# Patient Record
Sex: Male | Born: 1935 | Race: Black or African American | Hispanic: No | Marital: Married | Smoking: Never smoker
Health system: Southern US, Community
[De-identification: ages and names within clinical notes are randomized; demographics above are authoritative.]

## PROBLEM LIST (undated history)

## (undated) DIAGNOSIS — E119 Type 2 diabetes mellitus without complications: Secondary | ICD-10-CM

## (undated) DIAGNOSIS — H348122 Central retinal vein occlusion, left eye, stable: Secondary | ICD-10-CM

## (undated) DIAGNOSIS — R600 Localized edema: Secondary | ICD-10-CM

## (undated) DIAGNOSIS — L84 Corns and callosities: Secondary | ICD-10-CM

## (undated) DIAGNOSIS — M5030 Other cervical disc degeneration, unspecified cervical region: Secondary | ICD-10-CM

## (undated) DIAGNOSIS — N183 Chronic kidney disease, stage 3 unspecified: Secondary | ICD-10-CM

## (undated) DIAGNOSIS — H35039 Hypertensive retinopathy, unspecified eye: Secondary | ICD-10-CM

## (undated) DIAGNOSIS — I872 Venous insufficiency (chronic) (peripheral): Secondary | ICD-10-CM

## (undated) DIAGNOSIS — F528 Other sexual dysfunction not due to a substance or known physiological condition: Secondary | ICD-10-CM

## (undated) DIAGNOSIS — H539 Unspecified visual disturbance: Secondary | ICD-10-CM

## (undated) DIAGNOSIS — K5790 Diverticulosis of intestine, part unspecified, without perforation or abscess without bleeding: Secondary | ICD-10-CM

## (undated) DIAGNOSIS — E7800 Pure hypercholesterolemia, unspecified: Secondary | ICD-10-CM

## (undated) DIAGNOSIS — E669 Obesity, unspecified: Secondary | ICD-10-CM

## (undated) DIAGNOSIS — H524 Presbyopia: Secondary | ICD-10-CM

## (undated) DIAGNOSIS — M75120 Complete rotator cuff tear or rupture of unspecified shoulder, not specified as traumatic: Secondary | ICD-10-CM

## (undated) DIAGNOSIS — I2510 Atherosclerotic heart disease of native coronary artery without angina pectoris: Secondary | ICD-10-CM

## (undated) DIAGNOSIS — E1142 Type 2 diabetes mellitus with diabetic polyneuropathy: Secondary | ICD-10-CM

## (undated) DIAGNOSIS — G8929 Other chronic pain: Secondary | ICD-10-CM

## (undated) DIAGNOSIS — M2578 Osteophyte, vertebrae: Secondary | ICD-10-CM

## (undated) DIAGNOSIS — D126 Benign neoplasm of colon, unspecified: Secondary | ICD-10-CM

## (undated) DIAGNOSIS — Z961 Presence of intraocular lens: Secondary | ICD-10-CM

## (undated) DIAGNOSIS — H409 Unspecified glaucoma: Secondary | ICD-10-CM

## (undated) DIAGNOSIS — M25561 Pain in right knee: Secondary | ICD-10-CM

## (undated) DIAGNOSIS — K219 Gastro-esophageal reflux disease without esophagitis: Secondary | ICD-10-CM

## (undated) DIAGNOSIS — H401130 Primary open-angle glaucoma, bilateral, stage unspecified: Secondary | ICD-10-CM

## (undated) DIAGNOSIS — D239 Other benign neoplasm of skin, unspecified: Secondary | ICD-10-CM

## (undated) DIAGNOSIS — I10 Essential (primary) hypertension: Secondary | ICD-10-CM

---

## 2012-10-26 DIAGNOSIS — B353 Tinea pedis: Secondary | ICD-10-CM | POA: Insufficient documentation

## 2012-10-26 DIAGNOSIS — M75120 Complete rotator cuff tear or rupture of unspecified shoulder, not specified as traumatic: Secondary | ICD-10-CM | POA: Insufficient documentation

## 2012-10-26 DIAGNOSIS — K5730 Diverticulosis of large intestine without perforation or abscess without bleeding: Secondary | ICD-10-CM | POA: Insufficient documentation

## 2012-10-26 DIAGNOSIS — E1142 Type 2 diabetes mellitus with diabetic polyneuropathy: Secondary | ICD-10-CM | POA: Insufficient documentation

## 2012-12-11 DIAGNOSIS — H524 Presbyopia: Secondary | ICD-10-CM | POA: Insufficient documentation

## 2012-12-11 DIAGNOSIS — Z961 Presence of intraocular lens: Secondary | ICD-10-CM | POA: Insufficient documentation

## 2013-04-11 DIAGNOSIS — K5900 Constipation, unspecified: Secondary | ICD-10-CM | POA: Insufficient documentation

## 2013-04-16 DIAGNOSIS — K219 Gastro-esophageal reflux disease without esophagitis: Secondary | ICD-10-CM | POA: Insufficient documentation

## 2013-07-16 DIAGNOSIS — I2510 Atherosclerotic heart disease of native coronary artery without angina pectoris: Secondary | ICD-10-CM | POA: Insufficient documentation

## 2013-07-29 DIAGNOSIS — N183 Chronic kidney disease, stage 3 unspecified: Secondary | ICD-10-CM | POA: Insufficient documentation

## 2013-07-29 DIAGNOSIS — E7800 Pure hypercholesterolemia, unspecified: Secondary | ICD-10-CM | POA: Insufficient documentation

## 2013-07-29 DIAGNOSIS — H269 Unspecified cataract: Secondary | ICD-10-CM | POA: Insufficient documentation

## 2013-07-29 DIAGNOSIS — I10 Essential (primary) hypertension: Secondary | ICD-10-CM | POA: Insufficient documentation

## 2013-07-29 DIAGNOSIS — H35039 Hypertensive retinopathy, unspecified eye: Secondary | ICD-10-CM | POA: Insufficient documentation

## 2013-08-11 DIAGNOSIS — H401130 Primary open-angle glaucoma, bilateral, stage unspecified: Secondary | ICD-10-CM | POA: Insufficient documentation

## 2013-09-19 DIAGNOSIS — Z9849 Cataract extraction status, unspecified eye: Secondary | ICD-10-CM | POA: Insufficient documentation

## 2013-09-29 DIAGNOSIS — H348192 Central retinal vein occlusion, unspecified eye, stable: Secondary | ICD-10-CM | POA: Insufficient documentation

## 2013-11-11 DIAGNOSIS — E669 Obesity, unspecified: Secondary | ICD-10-CM | POA: Insufficient documentation

## 2014-02-17 DIAGNOSIS — L853 Xerosis cutis: Secondary | ICD-10-CM | POA: Insufficient documentation

## 2014-03-06 DIAGNOSIS — I872 Venous insufficiency (chronic) (peripheral): Secondary | ICD-10-CM | POA: Insufficient documentation

## 2014-04-13 DIAGNOSIS — Z961 Presence of intraocular lens: Secondary | ICD-10-CM | POA: Insufficient documentation

## 2014-04-13 DIAGNOSIS — E119 Type 2 diabetes mellitus without complications: Secondary | ICD-10-CM | POA: Insufficient documentation

## 2015-10-26 DIAGNOSIS — M2578 Osteophyte, vertebrae: Secondary | ICD-10-CM | POA: Insufficient documentation

## 2015-10-26 DIAGNOSIS — M5030 Other cervical disc degeneration, unspecified cervical region: Secondary | ICD-10-CM | POA: Insufficient documentation

## 2017-02-14 DIAGNOSIS — M25561 Pain in right knee: Secondary | ICD-10-CM

## 2017-02-14 DIAGNOSIS — R600 Localized edema: Secondary | ICD-10-CM | POA: Insufficient documentation

## 2017-02-14 DIAGNOSIS — G8929 Other chronic pain: Secondary | ICD-10-CM | POA: Insufficient documentation

## 2017-07-05 ENCOUNTER — Emergency Department (HOSPITAL_BASED_OUTPATIENT_CLINIC_OR_DEPARTMENT_OTHER)
Admission: EM | Admit: 2017-07-05 | Discharge: 2017-07-05 | Disposition: A | Discharge status: Home / Self Care | Payer: Medicare Other | Attending: Physician Assistant | Admitting: Physician Assistant

## 2017-07-05 ENCOUNTER — Other Ambulatory Visit: Payer: Self-pay

## 2017-07-05 ENCOUNTER — Encounter (HOSPITAL_BASED_OUTPATIENT_CLINIC_OR_DEPARTMENT_OTHER): Payer: Self-pay

## 2017-07-05 ENCOUNTER — Emergency Department (HOSPITAL_BASED_OUTPATIENT_CLINIC_OR_DEPARTMENT_OTHER): Payer: Medicare Other

## 2017-07-05 DIAGNOSIS — I10 Essential (primary) hypertension: Secondary | ICD-10-CM | POA: Insufficient documentation

## 2017-07-05 DIAGNOSIS — E119 Type 2 diabetes mellitus without complications: Secondary | ICD-10-CM | POA: Diagnosis not present

## 2017-07-05 DIAGNOSIS — L03116 Cellulitis of left lower limb: Secondary | ICD-10-CM | POA: Insufficient documentation

## 2017-07-05 DIAGNOSIS — Z23 Encounter for immunization: Secondary | ICD-10-CM | POA: Insufficient documentation

## 2017-07-05 DIAGNOSIS — M25512 Pain in left shoulder: Secondary | ICD-10-CM | POA: Diagnosis not present

## 2017-07-05 DIAGNOSIS — M79662 Pain in left lower leg: Secondary | ICD-10-CM | POA: Diagnosis present

## 2017-07-05 HISTORY — DX: Diverticulosis of intestine, part unspecified, without perforation or abscess without bleeding: K57.90

## 2017-07-05 HISTORY — DX: Unspecified glaucoma: H40.9

## 2017-07-05 HISTORY — DX: Gastro-esophageal reflux disease without esophagitis: K21.9

## 2017-07-05 HISTORY — DX: Pure hypercholesterolemia, unspecified: E78.00

## 2017-07-05 HISTORY — DX: Chronic kidney disease, stage 3 unspecified: N18.30

## 2017-07-05 HISTORY — DX: Central retinal vein occlusion, left eye, stable: H34.8122

## 2017-07-05 HISTORY — DX: Other benign neoplasm of skin, unspecified: D23.9

## 2017-07-05 HISTORY — DX: Pain in right knee: M25.561

## 2017-07-05 HISTORY — DX: Other sexual dysfunction not due to a substance or known physiological condition: F52.8

## 2017-07-05 HISTORY — DX: Presbyopia: H52.4

## 2017-07-05 HISTORY — DX: Hypertensive retinopathy, unspecified eye: H35.039

## 2017-07-05 HISTORY — DX: Essential (primary) hypertension: I10

## 2017-07-05 HISTORY — DX: Venous insufficiency (chronic) (peripheral): I87.2

## 2017-07-05 HISTORY — DX: Other chronic pain: G89.29

## 2017-07-05 HISTORY — DX: Unspecified visual disturbance: H53.9

## 2017-07-05 HISTORY — DX: Benign neoplasm of colon, unspecified: D12.6

## 2017-07-05 HISTORY — DX: Primary open-angle glaucoma, bilateral, stage unspecified: H40.1130

## 2017-07-05 HISTORY — DX: Corns and callosities: L84

## 2017-07-05 HISTORY — DX: Osteophyte, vertebrae: M25.78

## 2017-07-05 HISTORY — DX: Type 2 diabetes mellitus with diabetic polyneuropathy: E11.42

## 2017-07-05 HISTORY — DX: Localized edema: R60.0

## 2017-07-05 HISTORY — DX: Presence of intraocular lens: Z96.1

## 2017-07-05 HISTORY — DX: Chronic kidney disease, stage 3 (moderate): N18.3

## 2017-07-05 HISTORY — DX: Type 2 diabetes mellitus without complications: E11.9

## 2017-07-05 HISTORY — DX: Atherosclerotic heart disease of native coronary artery without angina pectoris: I25.10

## 2017-07-05 HISTORY — DX: Obesity, unspecified: E66.9

## 2017-07-05 HISTORY — DX: Complete rotator cuff tear or rupture of unspecified shoulder, not specified as traumatic: M75.120

## 2017-07-05 HISTORY — DX: Other cervical disc degeneration, unspecified cervical region: M50.30

## 2017-07-05 MED ORDER — ACETAMINOPHEN 325 MG PO TABS
650.0000 mg | ORAL_TABLET | Freq: Once | ORAL | Status: AC
Start: 2017-07-05 — End: 2017-07-05
  Administered 2017-07-05: 650 mg via ORAL
  Filled 2017-07-05: qty 2

## 2017-07-05 MED ORDER — TETANUS-DIPHTH-ACELL PERTUSSIS 5-2.5-18.5 LF-MCG/0.5 IM SUSP
0.5000 mL | Freq: Once | INTRAMUSCULAR | Status: AC
  Administered 2017-07-05: 0.5 mL via INTRAMUSCULAR
  Filled 2017-07-05: qty 0.5

## 2017-07-05 MED ORDER — CEPHALEXIN 500 MG PO CAPS: 500.0000 mg | ORAL_CAPSULE | Freq: Four times a day (QID) | ORAL | 0 refills | Status: DC

## 2017-07-05 MED ORDER — CEPHALEXIN 250 MG PO CAPS
500.0000 mg | ORAL_CAPSULE | Freq: Once | ORAL | Status: AC
  Administered 2017-07-05: 500 mg via ORAL
  Filled 2017-07-05: qty 2

## 2017-07-05 NOTE — ED Provider Notes (Signed)
[facility] Provider Note   CSN: [id number] Arrival date & time: 07/05/17  1157     History   Chief Complaint Chief Complaint  Patient presents with  . Trauma    HPI Jesus Atkins is a 84 y.o. male.  HPI  Patient is an 84 year old male presenting after a fall 6 days ago.  Patient reports that he was going up an incline in his tractor and he and the tractor fell over.  He reports injury to his left lower leg and his left shoulder.  Patient reports that his been about a week but he still has pain with full range of motion of his shoulder.  And a little bit of tenderness in his left lower leg.  Patient reports he still able to move everything so he is sure that nothing is broken want to come here to get evaluated.  He just noted today that he had some redness in that left lower extremity.  Past Medical History:  Diagnosis Date  . Diabetes mellitus without complication ([place])   . Glaucoma   . High cholesterol   . Hypertension     There are no active problems to display for this patient.       Home Medications    Prior to Admission medications   Not on File    Family History No family history on file.  Social History Social History   Tobacco Use  . Smoking status: Never Smoker  . Smokeless tobacco: Never Used  Substance Use Topics  . Alcohol use: No    Frequency: Never  . Drug use: No     Allergies   Patient has no known allergies.   Review of Systems Review of Systems  Constitutional: Negative for activity change.  Respiratory: Negative for shortness of breath.   Cardiovascular: Negative for chest pain.  Gastrointestinal: Negative for abdominal pain.     Physical Exam Updated Vital Signs BP (!) 146/81 (BP Location: Right Arm)   Pulse 72   Temp 98.4 F (36.9 C) (Oral)   Resp 18   SpO2 97%   Physical Exam  Constitutional: He is oriented to person, place, and time. He appears well-nourished.  HENT:  Head:  Normocephalic.  Eyes: Conjunctivae are normal.  Cardiovascular: Normal rate and regular rhythm.  No murmur heard. Pulmonary/Chest: Effort normal and breath sounds normal. No respiratory distress.  Musculoskeletal:  Good range of motion of knee and ankle.  Mild warmth and erythema to the tib-fib area.  Left shoulder with good range of motion.  Patient reports a little bit of pain at complete extension however patient is able to do it easily without numbness.  Neurological: He is oriented to person, place, and time.  Skin: Skin is warm and dry. He is not diaphoretic.  Left lower leg with erythema and warmth.  Psychiatric: He has a normal mood and affect. His behavior is normal.     ED Treatments / Results  Labs (all labs ordered are listed, but only abnormal results are displayed) Labs Reviewed - No data to display  EKG  EKG Interpretation None       Radiology Dg Tibia/fibula Left  Result Date: 07/05/2017 CLINICAL DATA:  Left lower leg pain, redness and swelling since a yard tractor fell on the patient 06/29/2017. Initial encounter. EXAM: LEFT TIBIA AND FIBULA - 2 VIEW COMPARISON:  None. FINDINGS: No acute bony or joint abnormality is identified. There is infiltration of subcutaneous fat which could be due  to edema, cellulitis or contusion. Mild degenerative disease about the knee is noted. IMPRESSION: No acute bony abnormality. Infiltration of subcutaneous fat could be due to edema, cellulitis or contusion. Electronically Signed   By: Inge Rise M.D.   On: 07/05/2017 13:35   Dg Shoulder Left  Result Date: 07/05/2017 CLINICAL DATA:  Left shoulder pain after tractor injury. EXAM: LEFT SHOULDER - 2+ VIEW COMPARISON:  No recent. FINDINGS: Prominent acromioclavicular and glenohumeral degenerative change. Small bony density noted adjacent to the superior glenoid labrum. A fracture of the superior glenoid labrum, age undetermined could present in this fashion. A loose body could also  present this fashion. IMPRESSION: Prominent acromioclavicular glenohumeral degenerative change. Small bony density noted along the superior glenoid labrum. This may represent a fracture, age undetermined. This could also represent a loose body. Electronically Signed   By: Marcello Moores  Register   On: 07/05/2017 13:33    Procedures Procedures (including critical care time)  Medications Ordered in ED Medications  Tdap (BOOSTRIX) injection 0.5 mL (0.5 mLs Intramuscular Given 07/05/17 1524)  acetaminophen (TYLENOL) tablet 650 mg (650 mg Oral Given 07/05/17 1524)     Initial Impression / Assessment and Plan / ED Course  I have reviewed the triage vital signs and the nursing notes.  Pertinent labs & imaging results that were available during my care of the patient were reviewed by me and considered in my medical decision making (see chart for details).     Patient is an 84 year old male presenting after a fall 6 days ago.  Patient reports that he was going up an incline in his tractor and he and the tractor fell over.  He reports injury to his left lower leg and his left shoulder.  Patient reports that his been about a week but he still has pain with full range of motion of his shoulder.  And a little bit of tenderness in his left lower leg.  Patient reports he still able to move everything so he is sure that nothing is broken want to come here to get evaluated.  He just noted today that he had some redness in that left lower extremity.  3:50 PM X-ray [place] about whether there is a fracture or not.  Given that there is no erythema or or signs of trauma, I do not think is likely to have a fracture.  Will have him follow-up with orthopedics.  And his primary care.  As for his left lower leg.  I think he likely sustained an small abrasion during the fall which is now turned into cellulitis.  However will do ultrasound today to rule out DVT.  [place] negative., Will treat for cellulitis, have him followiwth  PCP this week and ortho for his shoudler.   Final Clinical Impressions(s) / ED Diagnoses   Final diagnoses:  None    ED Discharge Orders    None       Macarthur Critchley, MD 07/05/17 2305

## 2017-07-05 NOTE — Discharge Instructions (Signed)
Please follow-up with your primary care provider in 3 days to recheck your cellulitis.  You can also follow-up with the orthopedist about your left shoulder pain.

## 2017-07-05 NOTE — ED Triage Notes (Signed)
Pt states a tractor turned over on him 6 days ago-pain to left shoulder and left tib/fib-pt NAD-walking with limp and a cane

## 2017-07-05 NOTE — ED Notes (Signed)
Patient transported to Ultrasound 

## 2017-07-10 ENCOUNTER — Ambulatory Visit: Payer: Medicare Other | Admitting: Family Medicine

## 2017-07-10 ENCOUNTER — Ambulatory Visit: Payer: Self-pay

## 2017-07-10 ENCOUNTER — Encounter: Payer: Self-pay | Admitting: Family Medicine

## 2017-07-10 VITALS — BP 148/75 | HR 82 | Ht 72.0 in | Wt 246.0 lb

## 2017-07-10 DIAGNOSIS — M25512 Pain in left shoulder: Secondary | ICD-10-CM | POA: Diagnosis not present

## 2017-07-10 DIAGNOSIS — M79605 Pain in left leg: Secondary | ICD-10-CM

## 2017-07-10 NOTE — Patient Instructions (Signed)
Start using your compression stockings for your legs. Finish the keflex as directed. Elevate above your heart when possible to help with the swelling.  You have strained your rotator cuff with a small tear in your supraspinatus. Try to avoid painful activities (overhead activities, lifting with extended arm) as much as possible for the next 5 weeks. Topical capsaicin, biofreeze, salon pas may be helpful if needed for pain. Can take tylenol in addition to this. Consider physical therapy with transition to home exercise program. Do home exercise program with theraband and scapular stabilization exercises daily 3 sets of 10 once a day - wait about a week before starting this. If not improving at follow-up we will consider further imaging, physical therapy, and/or nitro patches. Follow up with me in 1 month for reevaluation.

## 2017-07-11 ENCOUNTER — Encounter: Payer: Self-pay | Admitting: Family Medicine

## 2017-07-11 DIAGNOSIS — M25512 Pain in left shoulder: Secondary | ICD-10-CM | POA: Insufficient documentation

## 2017-07-11 DIAGNOSIS — M79605 Pain in left leg: Secondary | ICD-10-CM | POA: Insufficient documentation

## 2017-07-11 NOTE — Assessment & Plan Note (Signed)
advised to complete keflex.  Start using his compression stockings for venous insufficiency.  Elevate above his heart.

## 2017-07-11 NOTE — Progress Notes (Signed)
PCP: Beckie Salts, MD  Subjective:   HPI: Patient is a 84 y.o. male here for left shoulder, leg injuries.  Patient reports 1 week ago he was on a riding lawnmower collective leaves and he turned, the lawnmower fell over. This did not land on him but threw him clear - thinks left leg struck steering wheel as he went down though. Landed directly onto left shoulder. Pain primarily in his shoulder now at 8/10 level, sharp, worse with overhead motions and trying to lift items. He also has some left leg pain that was up to 8/10. He is taking keflex for possible cellulitis. No other skin changes. No fevers, numbness.  Past Medical History:  Diagnosis Date  . Benign neoplasm of colon   . Benign neoplasm of skin   . Bilateral lower extremity edema   . Central retinal vein occlusion of left eye   . Chronic pain of right knee   . Complete rupture of rotator cuff   . Corns and callosity   . Coronary atherosclerosis   . Degenerative disc disease, cervical   . Diabetes mellitus without complication ([place])   . Diabetic polyneuropathy associated with type 2 diabetes mellitus ([place])   . Diverticulosis   . GERD (gastroesophageal reflux disease)   . Glaucoma   . High cholesterol   . Hypertension   . Hypertensive retinopathy   . Obesity   . Osteophyte of cervical spine   . Presbyopia   . Primary open angle glaucoma (POAG) of both eyes   . Pseudophakia of both eyes   . Psychosexual dysfunction with inhibited sexual excitement   . Stage 3 chronic kidney disease ([place])   . Stasis dermatitis of both legs   . Visual disturbances   . Visual disturbances     Current Outpatient Medications on File Prior to Visit  Medication Sig Dispense Refill  . acetaminophen (TYLENOL) 500 MG tablet Take 500 mg by mouth every 6 (six) hours as needed.    . brimonidine (ALPHAGAN P) 0.1 % SOLN     . cephALEXin (KEFLEX) 500 MG capsule Take 1 capsule (500 mg total) by mouth 4 (four) times daily. 40 capsule 0  .  diclofenac sodium (VOLTAREN) 1 % GEL Apply topically 4 (four) times daily.    . furosemide (LASIX) 20 MG tablet Take 20 mg by mouth.    Marland Kitchen glimepiride (AMARYL) 2 MG tablet Take 2 mg by mouth daily with breakfast.    . latanoprost (XALATAN) 0.005 % ophthalmic solution 1 drop at bedtime.    Marland Kitchen losartan (COZAAR) 50 MG tablet Take 50 mg by mouth daily.    . metoprolol succinate (TOPROL-XL) 25 MG 24 hr tablet Take 25 mg by mouth daily.    . simvastatin (ZOCOR) 10 MG tablet Take 10 mg by mouth daily.    . timolol (BETIMOL) 0.25 % ophthalmic solution 1-2 drops 2 (two) times daily.    . timolol (TIMOPTIC) 0.5 % ophthalmic solution 1 drop 2 (two) times daily.     No current facility-administered medications on file prior to visit.     History reviewed. No pertinent surgical history.  Allergies  Allergen Reactions  . Ace Inhibitors   . Dorzolamide   . Lovastatin     Social History   Socioeconomic History  . Marital status: Married    Spouse name: Not on file  . Number of children: Not on file  . Years of education: Not on file  . Highest education level: Not on file  Social Needs  . Financial resource strain: Not on file  . Food insecurity - worry: Not on file  . Food insecurity - inability: Not on file  . Transportation needs - medical: Not on file  . Transportation needs - non-medical: Not on file  Occupational History  . Not on file  Tobacco Use  . Smoking status: Never Smoker  . Smokeless tobacco: Never Used  Substance and Sexual Activity  . Alcohol use: No    Frequency: Never  . Drug use: No  . Sexual activity: Not on file  Other Topics Concern  . Not on file  Social History Narrative  . Not on file    History reviewed. No pertinent family history.  BP (!) 148/75   Pulse 82   Ht 6' (1.829 m)   Wt 246 lb (111.6 kg)   BMI 33.36 kg/m   Review of Systems: See HPI above.     Objective:  Physical Exam:  Gen: NAD, comfortable in exam room  Left shoulder: No  swelling, ecchymoses.  No gross deformity. No TTP of AC joint, biceps tendon. Full ER, IR.  Abduction and flexion limited to about 120 degrees, painful. Positive Hawkins, Neers. Negative Yergasons. Strength 4/5 with empty can and 5/5 resisted internal/external rotation.  Pain with empty can. Negative apprehension. NV intact distally.  Right shoulder: No swelling, ecchymoses.  No gross deformity. No TTP. FROM. Strength 5/5 with empty can and resisted internal/external rotation. NV intact distally.  Left lower leg: Chronic skin changes with mild thickening, hyperpigmentation and edema.  Minimal redness and warmth but similar to right lower leg. TTP over anterior tibia similar to right leg.  No calf tenderness. FROM ankle and knee with 5/5 strength, no pain. NVI distally.  MSK u/s left shoulder:  Subscapularis and infraspinatus without apparent tears.  Supraspinatus appears thinner than expected with a partial thickness tear evident on insertional side.   Assessment & Plan:  1. Left shoulder injury - 2/2 rotator cuff strain with small supraspinatus tear.  He's had problems with shoulder in past and noted rotator cuff tear on his problem list though other records not available - discussed possible this is an old tear.  Start with relative rest, home exercise program.  Tylenol with topical medications.  Consider physical therapy, nitro patches.  F/u in 1 month.  2. Left leg pain - advised to complete keflex.  Start using his compression stockings for venous insufficiency.  Elevate above his heart.

## 2017-07-11 NOTE — Assessment & Plan Note (Signed)
2/2 rotator cuff strain with small supraspinatus tear.  He's had problems with shoulder in past and noted rotator cuff tear on his problem list though other records not available - discussed possible this is an old tear.  Start with relative rest, home exercise program.  Tylenol with topical medications.  Consider physical therapy, nitro patches.  F/u in 1 month.

## 2017-08-07 ENCOUNTER — Encounter: Payer: Self-pay | Admitting: Family Medicine

## 2017-08-07 ENCOUNTER — Ambulatory Visit: Payer: Medicare Other | Admitting: Family Medicine

## 2017-08-07 DIAGNOSIS — M25512 Pain in left shoulder: Secondary | ICD-10-CM | POA: Diagnosis not present

## 2017-08-07 DIAGNOSIS — M79605 Pain in left leg: Secondary | ICD-10-CM | POA: Diagnosis not present

## 2017-08-07 NOTE — Patient Instructions (Signed)
You're doing great! Use compression stocking on your legs to help with the swelling. This small hematoma below your knee should improve with time and go away. Icing only if needed, tylenol only if needed at this point. Elevate above your heart when possible for swelling if needed.

## 2017-08-08 ENCOUNTER — Encounter: Payer: Self-pay | Admitting: Family Medicine

## 2017-08-08 NOTE — Assessment & Plan Note (Signed)
2/2 rotator cuff strain with small supraspinatus tear which is possibly old.  Much improved clinically.  Icing, tylenol only if needed.  No restrictions.  F/u prn.

## 2017-08-08 NOTE — Progress Notes (Signed)
PCP: Beckie Salts, MD  Subjective:   HPI: Patient is a 84 y.o. male here for left shoulder, leg injuries.  2/19: Patient reports 1 week ago he was on a riding lawnmower collective leaves and he turned, the lawnmower fell over. This did not land on him but threw him clear - thinks left leg struck steering wheel as he went down though. Landed directly onto left shoulder. Pain primarily in his shoulder now at 8/10 level, sharp, worse with overhead motions and trying to lift items. He also has some left leg pain that was up to 8/10. He is taking keflex for possible cellulitis. No other skin changes. No fevers, numbness.  3/19: Patient reports he's doing well. He has some mild nagging pain lateral left shoulder only at nighttime and not every night. His left leg is better - still gets swelling but the warmth, redness has resolved. He is done with antibiotics for this. No skin changes, numbness.  Past Medical History:  Diagnosis Date  . Benign neoplasm of colon   . Benign neoplasm of skin   . Bilateral lower extremity edema   . Central retinal vein occlusion of left eye   . Chronic pain of right knee   . Complete rupture of rotator cuff   . Corns and callosity   . Coronary atherosclerosis   . Degenerative disc disease, cervical   . Diabetes mellitus without complication ([place])   . Diabetic polyneuropathy associated with type 2 diabetes mellitus ([place])   . Diverticulosis   . GERD (gastroesophageal reflux disease)   . Glaucoma   . High cholesterol   . Hypertension   . Hypertensive retinopathy   . Obesity   . Osteophyte of cervical spine   . Presbyopia   . Primary open angle glaucoma (POAG) of both eyes   . Pseudophakia of both eyes   . Psychosexual dysfunction with inhibited sexual excitement   . Stage 3 chronic kidney disease ([place])   . Stasis dermatitis of both legs   . Visual disturbances   . Visual disturbances     Current Outpatient Medications on File Prior to Visit   Medication Sig Dispense Refill  . acetaminophen (TYLENOL) 500 MG tablet Take 500 mg by mouth every 6 (six) hours as needed.    . brimonidine (ALPHAGAN P) 0.1 % SOLN     . cephALEXin (KEFLEX) 500 MG capsule Take 1 capsule (500 mg total) by mouth 4 (four) times daily. 40 capsule 0  . diclofenac sodium (VOLTAREN) 1 % GEL Apply topically 4 (four) times daily.    . furosemide (LASIX) 20 MG tablet Take 20 mg by mouth.    Marland Kitchen glimepiride (AMARYL) 2 MG tablet Take 2 mg by mouth daily with breakfast.    . latanoprost (XALATAN) 0.005 % ophthalmic solution 1 drop at bedtime.    Marland Kitchen losartan (COZAAR) 50 MG tablet Take 50 mg by mouth daily.    . metoprolol succinate (TOPROL-XL) 25 MG 24 hr tablet Take 25 mg by mouth daily.    . simvastatin (ZOCOR) 10 MG tablet Take 10 mg by mouth daily.    . timolol (BETIMOL) 0.25 % ophthalmic solution 1-2 drops 2 (two) times daily.    . timolol (TIMOPTIC) 0.5 % ophthalmic solution 1 drop 2 (two) times daily.     No current facility-administered medications on file prior to visit.     History reviewed. No pertinent surgical history.  Allergies  Allergen Reactions  . Ace Inhibitors   . Dorzolamide   .  Lovastatin     Social History   Socioeconomic History  . Marital status: Married    Spouse name: Not on file  . Number of children: Not on file  . Years of education: Not on file  . Highest education level: Not on file  Social Needs  . Financial resource strain: Not on file  . Food insecurity - worry: Not on file  . Food insecurity - inability: Not on file  . Transportation needs - medical: Not on file  . Transportation needs - non-medical: Not on file  Occupational History  . Not on file  Tobacco Use  . Smoking status: Never Smoker  . Smokeless tobacco: Never Used  Substance and Sexual Activity  . Alcohol use: No    Frequency: Never  . Drug use: No  . Sexual activity: Not on file  Other Topics Concern  . Not on file  Social History Narrative  . Not  on file    History reviewed. No pertinent family history.  BP 113/74   Pulse 90   Ht 6' (1.829 m)   Wt 246 lb (111.6 kg)   BMI 33.36 kg/m   Review of Systems: See HPI above.     Objective:  Physical Exam:  Gen: NAD, comfortable in exam room.  Left shoulder: No swelling, ecchymoses.  No gross deformity. No TTP. Full ER, IR, abduction and flexion to 150 degrees without painful arc. Negative Hawkins, Neers. Negative Yergasons. Strength 5/5 with empty can and resisted internal/external rotation. Negative apprehension. NV intact distally.  Left lower leg: Chronic skin changes with mild thickening, hyperpigmentation, and edema.  Small focal area of tenderness and swelling proximal anterior lower leg.  No redness or warmth now. No other tenderness to palpation. FROM knee without pain.  5/5 strength.   Assessment & Plan:  1. Left shoulder injury - 2/2 rotator cuff strain with small supraspinatus tear which is possibly old.  Much improved clinically.  Icing, tylenol only if needed.  No restrictions.  F/u prn.  2. Left leg pain - encouraged compression stockings.  Elevation, icing, tylenol if needed.

## 2017-08-08 NOTE — Assessment & Plan Note (Signed)
encouraged compression stockings.  Elevation, icing, tylenol if needed.

## 2019-05-09 IMAGING — DX DG TIBIA/FIBULA 2V*L*
4 series · 4 of 4 positions shown · non-contrast
Comparison: None.

CLINICAL DATA: Left lower leg pain, redness and swelling since a
yard tractor fell on the patient 06/29/2017. Initial encounter.

EXAM:
LEFT TIBIA AND FIBULA - 2 VIEW

[tibia ap (1 of 2)]
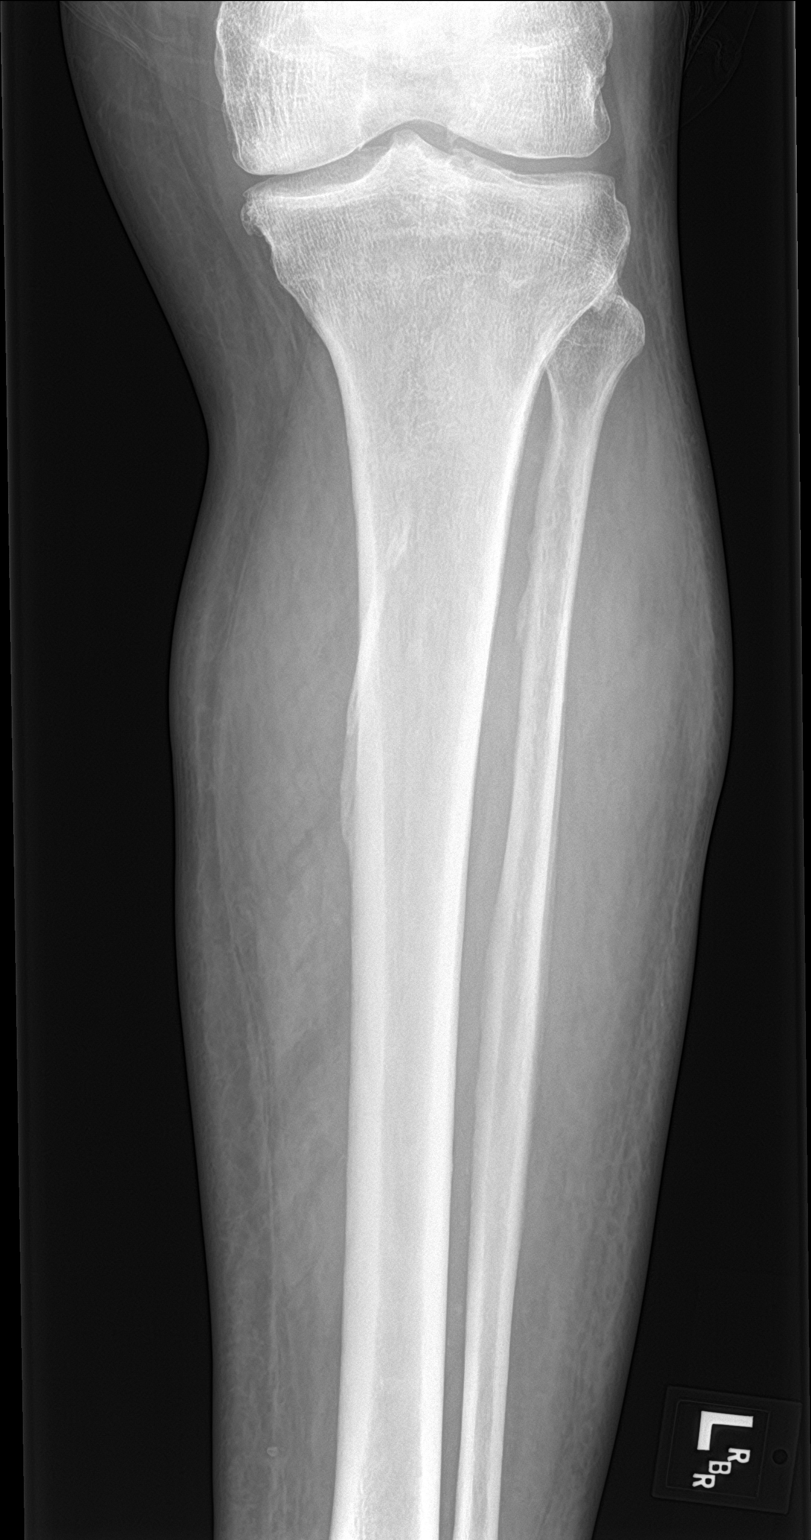

[tibia ap (2 of 2)]
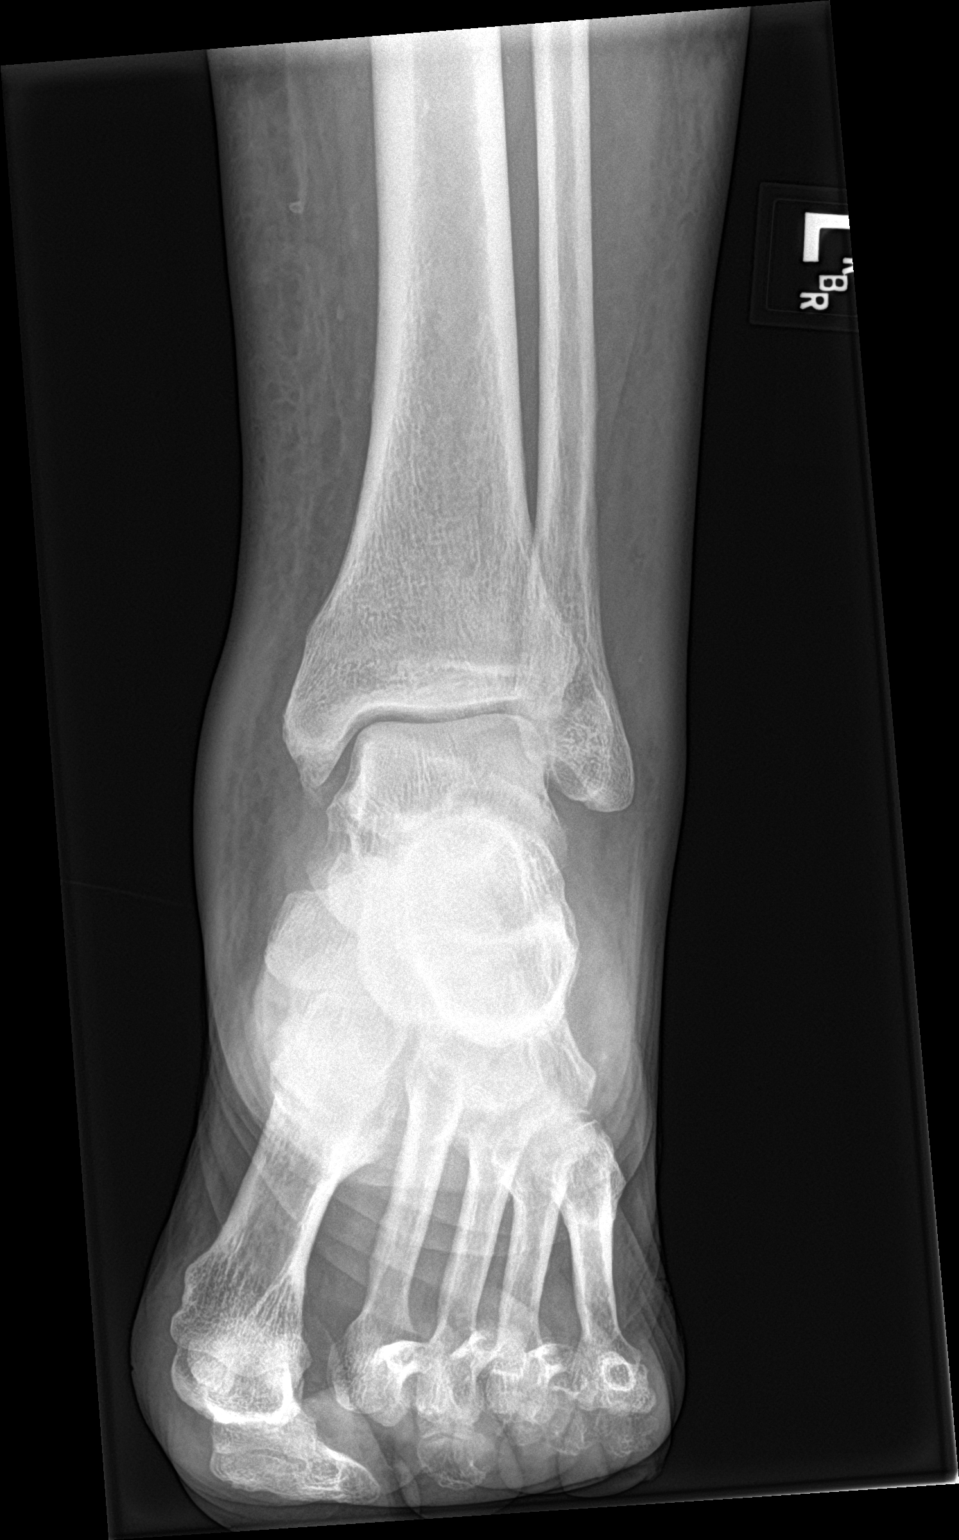

[tibia lat (1 of 2)]
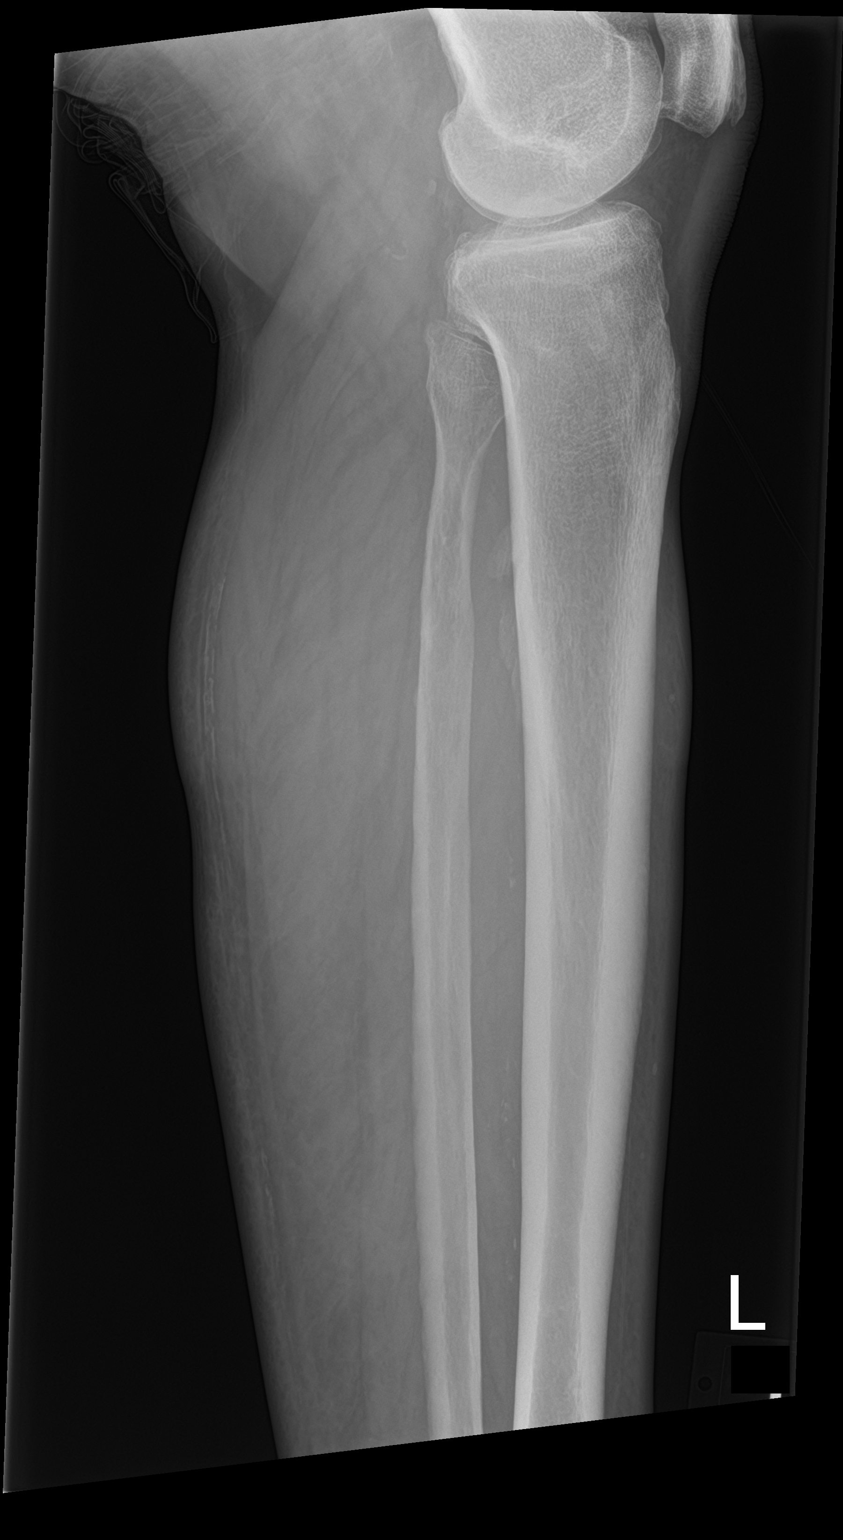

[tibia lat (2 of 2)]
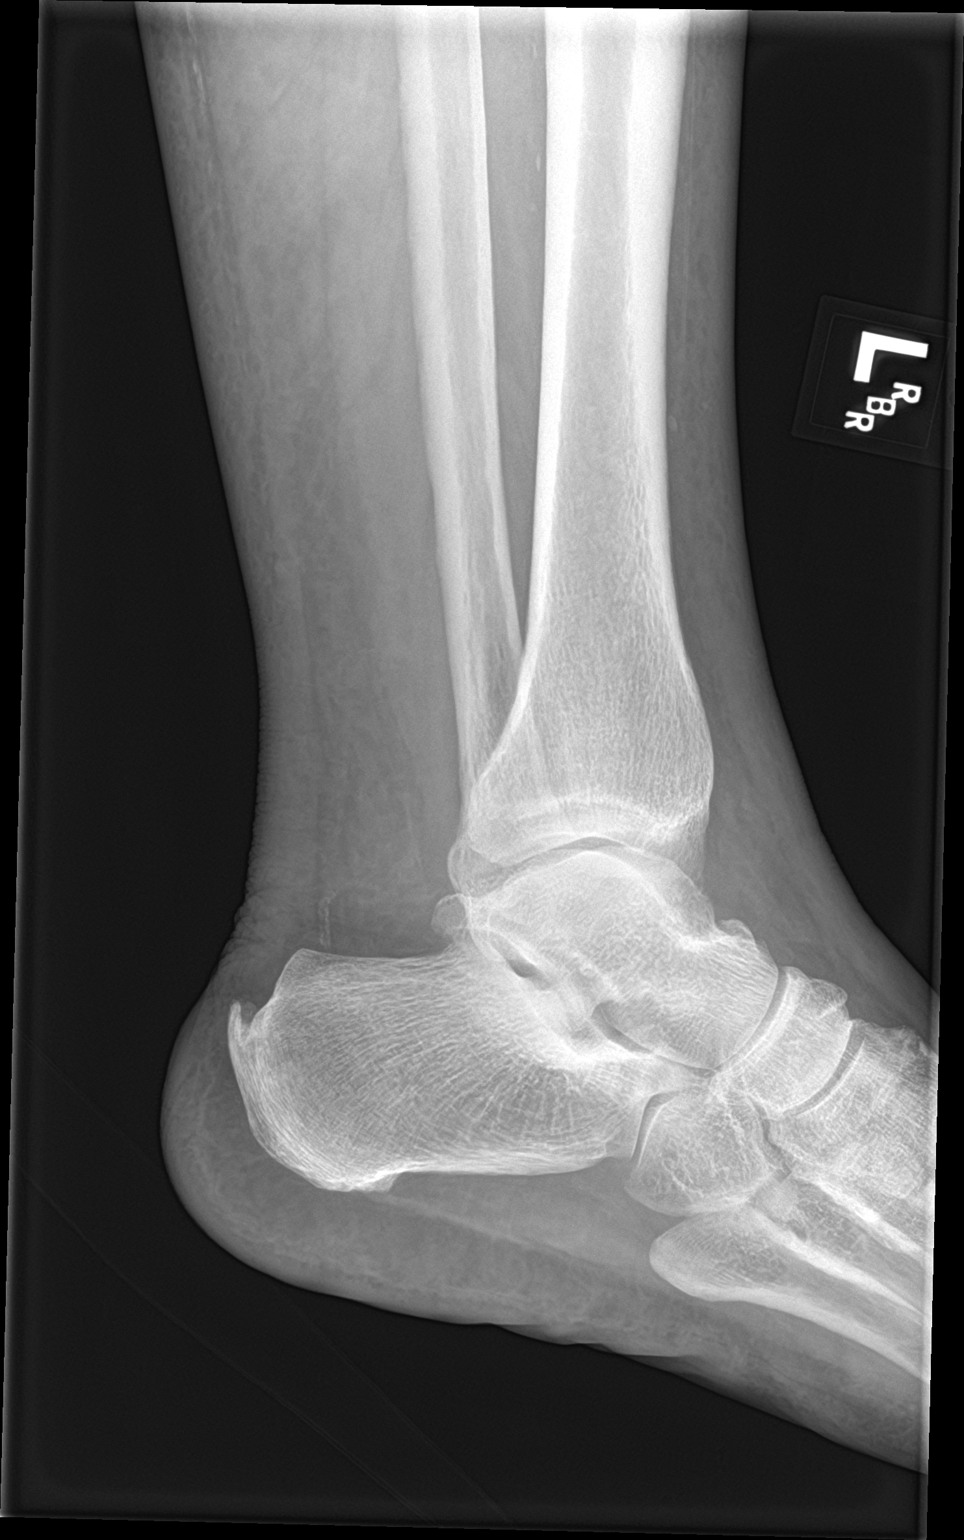

[4 of 4 positions shown; findings below may reference images not displayed]

FINDINGS: No acute bony or joint abnormality is identified. There is
infiltration of subcutaneous fat which could be due to edema,
cellulitis or contusion. Mild degenerative disease about the knee is
noted.
IMPRESSION: No acute bony abnormality.

Infiltration of subcutaneous fat could be due to edema, cellulitis
or contusion.

## 2019-12-08 ENCOUNTER — Ambulatory Visit: Payer: Self-pay

## 2019-12-08 ENCOUNTER — Ambulatory Visit (INDEPENDENT_AMBULATORY_CARE_PROVIDER_SITE_OTHER): Payer: Medicare Other | Admitting: Family Medicine

## 2019-12-08 VITALS — BP 138/62 | Ht 71.5 in | Wt 242.0 lb

## 2019-12-08 DIAGNOSIS — M25512 Pain in left shoulder: Secondary | ICD-10-CM

## 2019-12-08 NOTE — Patient Instructions (Addendum)
You have torn your rotator cuff in your left shoulder. Consider an MRI only if you'd consider having surgery to fix this - MRI would tell us to what extent this could be fixed. Sling if needed to help with pain. Icing 15 minutes at a time 3-4 times a day. Tylenol 500mg  1-2 tabs three times a day as needed for pain. Topical voltaren gel up to 4 times a day as needed for pain. Do motion exercises as directed at least twice a day. Follow up with me in 1 month.

## 2019-12-09 ENCOUNTER — Encounter: Payer: Self-pay | Admitting: Family Medicine

## 2019-12-09 NOTE — Progress Notes (Signed)
PCP: Beckie Salts, MD  Subjective:   HPI: Patient is a 86 y.o. male here for left shoulder injury.  Patient reports on 7/13 he stepped off a curb and fell landing directly onto his left shoulder with his hand behind him. Immediate pain, difficulty moving left arm since that time - reports having some difficulties prior to this. Swelling and bruising into anterior arm and elbow. Radiographs of left shoulder were negative for acute fracture, separation, dislocation.  Past Medical History:  Diagnosis Date  . Benign neoplasm of colon   . Benign neoplasm of skin   . Bilateral lower extremity edema   . Central retinal vein occlusion of left eye   . Chronic pain of right knee   . Complete rupture of rotator cuff   . Corns and callosity   . Coronary atherosclerosis   . Degenerative disc disease, cervical   . Diabetes mellitus without complication ([place])   . Diabetic polyneuropathy associated with type 2 diabetes mellitus ([place])   . Diverticulosis   . GERD (gastroesophageal reflux disease)   . Glaucoma   . High cholesterol   . Hypertension   . Hypertensive retinopathy   . Obesity   . Osteophyte of cervical spine   . Presbyopia   . Primary open angle glaucoma (POAG) of both eyes   . Pseudophakia of both eyes   . Psychosexual dysfunction with inhibited sexual excitement   . Stage 3 chronic kidney disease   . Stasis dermatitis of both legs   . Visual disturbances   . Visual disturbances     Current Outpatient Medications on File Prior to Visit  Medication Sig Dispense Refill  . acetaminophen (TYLENOL) 500 MG tablet Take 500 mg by mouth every 6 (six) hours as needed.    . brimonidine (ALPHAGAN P) 0.1 % SOLN     . diclofenac sodium (VOLTAREN) 1 % GEL Apply topically 4 (four) times daily.    Marland Kitchen ELIQUIS 5 MG TABS tablet Take 5 mg by mouth 2 (two) times daily.    . furosemide (LASIX) 40 MG tablet Take 40 mg by mouth daily.    Marland Kitchen glimepiride (AMARYL) 2 MG tablet Take 2 mg by mouth daily  with breakfast.    . glipiZIDE (GLUCOTROL XL) 10 MG 24 hr tablet Take 10 mg by mouth daily.    Marland Kitchen latanoprost (XALATAN) 0.005 % ophthalmic solution 1 drop at bedtime.    Marland Kitchen losartan (COZAAR) 50 MG tablet Take 50 mg by mouth daily.    . metoprolol succinate (TOPROL-XL) 25 MG 24 hr tablet Take 25 mg by mouth daily.    . simvastatin (ZOCOR) 10 MG tablet Take 10 mg by mouth daily.    . timolol (BETIMOL) 0.25 % ophthalmic solution 1-2 drops 2 (two) times daily.    . timolol (TIMOPTIC) 0.5 % ophthalmic solution 1 drop 2 (two) times daily.     No current facility-administered medications on file prior to visit.    History reviewed. No pertinent surgical history.  Allergies  Allergen Reactions  . Ace Inhibitors   . Dorzolamide   . Lovastatin     Social History   Socioeconomic History  . Marital status: Married    Spouse name: Not on file  . Number of children: Not on file  . Years of education: Not on file  . Highest education level: Not on file  Occupational History  . Not on file  Tobacco Use  . Smoking status: Never Smoker  . Smokeless tobacco: Never  Used  Substance and Sexual Activity  . Alcohol use: No  . Drug use: No  . Sexual activity: Not on file  Other Topics Concern  . Not on file  Social History Narrative  . Not on file   Social Determinants of Health   Financial Resource Strain:   . Difficulty of Paying Living Expenses:   Food Insecurity:   . Worried About Charity fundraiser in the Last Year:   . Arboriculturist in the Last Year:   Transportation Needs:   . Film/video editor (Medical):   Marland Kitchen Lack of Transportation (Non-Medical):   Physical Activity:   . Days of Exercise per Week:   . Minutes of Exercise per Session:   Stress:   . Feeling of Stress :   Social Connections:   . Frequency of Communication with Friends and Family:   . Frequency of Social Gatherings with Friends and Family:   . Attends Religious Services:   . Active Member of Clubs or  Organizations:   . Attends Archivist Meetings:   Marland Kitchen Marital Status:   Intimate Partner Violence:   . Fear of Current or Ex-Partner:   . Emotionally Abused:   Marland Kitchen Physically Abused:   . Sexually Abused:     History reviewed. No pertinent family history.  BP 138/62   Ht 5' 11.5" (1.816 m)   Wt 242 lb (109.8 kg)   BMI 33.28 kg/m   Review of Systems: See HPI above.     Objective:  Physical Exam:  Gen: NAD, comfortable in exam room  Left shoulder: Mild swelling.  Bruising into anterior distal upper arm.  No other gross deformity. TTP anteriorly over biceps tendon.  No other tenderness. Passive ROM to 30 degrees ER, 90 abduction and flexion.  Actively only able to do full IR.  No active abduction, flexion, ER.  Full flexion at elbow. Strength 2/5 with IR.  0/5 with ER.  Cannot position for empty can. NV intact distally.   MSK u/s left shoulder: Biceps tendon ruptured with mild retraction. Pec major tendon intact. Subscapularis with full thickness tear with retraction Infraspinatus and teres minor also with full thickness tears with retraction. Supraspinatus not visualized - appears completely torn and retracted. AC joint appears normal.  Assessment & Plan:  1. Left shoulder injury - Uncertain to what extent his ultrasound findings are chronic given amount of retraction, multitendon involvement.  We discussed MRI to assess degree of atrophy and how much of this would be reparable but only if he would consider rotator cuff repair.  He declined at this time and would like to do conservative treatment - we discussed pain likely to improve but function of shoulder would be extremely limited - he verbalized understanding.  Sling, icing, tylenol, topical voltaren gel.  [place] exercises.  F/u in 1 month.

## 2020-01-07 ENCOUNTER — Other Ambulatory Visit: Payer: Self-pay

## 2020-01-07 ENCOUNTER — Encounter: Payer: Self-pay | Admitting: Family Medicine

## 2020-01-07 ENCOUNTER — Ambulatory Visit (INDEPENDENT_AMBULATORY_CARE_PROVIDER_SITE_OTHER): Payer: Medicare Other | Admitting: Family Medicine

## 2020-01-07 VITALS — BP 148/77 | Ht 71.5 in | Wt 242.0 lb

## 2020-01-07 DIAGNOSIS — M25512 Pain in left shoulder: Secondary | ICD-10-CM

## 2020-01-07 MED ORDER — DICLOFENAC SODIUM 1 % EX GEL: 4.0000 g | Freq: Four times a day (QID) | CUTANEOUS | 3 refills | Status: AC

## 2020-01-07 NOTE — Progress Notes (Signed)
PCP: Beckie Salts, MD  Subjective:   HPI: Patient is a 86 y.o. male here for follow up of left shoulder pain. He states that his pain and range of motion have improved. He now has no pain at rest, only when he tries to lift his arm. He has been doing the motion exercises twice daily, which he thinks have helped. Denies radiation of pain, numbness, or tingling down his arm. He is not taking any pain medicine other than Voltaren gel, which he says helps. He is not interested in pursuing surgery or formal physical therapy.   Past Medical History:  Diagnosis Date  . Benign neoplasm of colon   . Benign neoplasm of skin   . Bilateral lower extremity edema   . Central retinal vein occlusion of left eye   . Chronic pain of right knee   . Complete rupture of rotator cuff   . Corns and callosity   . Coronary atherosclerosis   . Degenerative disc disease, cervical   . Diabetes mellitus without complication ([place])   . Diabetic polyneuropathy associated with type 2 diabetes mellitus ([place])   . Diverticulosis   . GERD (gastroesophageal reflux disease)   . Glaucoma   . High cholesterol   . Hypertension   . Hypertensive retinopathy   . Obesity   . Osteophyte of cervical spine   . Presbyopia   . Primary open angle glaucoma (POAG) of both eyes   . Pseudophakia of both eyes   . Psychosexual dysfunction with inhibited sexual excitement   . Stage 3 chronic kidney disease   . Stasis dermatitis of both legs   . Visual disturbances   . Visual disturbances     Current Outpatient Medications on File Prior to Visit  Medication Sig Dispense Refill  . acetaminophen (TYLENOL) 500 MG tablet Take 500 mg by mouth every 6 (six) hours as needed.    . brimonidine (ALPHAGAN P) 0.1 % SOLN     . ELIQUIS 5 MG TABS tablet Take 5 mg by mouth 2 (two) times daily.    . furosemide (LASIX) 40 MG tablet Take 40 mg by mouth daily.    Marland Kitchen glimepiride (AMARYL) 2 MG tablet Take 2 mg by mouth daily with breakfast.    .  glipiZIDE (GLUCOTROL XL) 10 MG 24 hr tablet Take 10 mg by mouth daily.    Marland Kitchen latanoprost (XALATAN) 0.005 % ophthalmic solution 1 drop at bedtime.    Marland Kitchen losartan (COZAAR) 50 MG tablet Take 50 mg by mouth daily.    . metoprolol succinate (TOPROL-XL) 25 MG 24 hr tablet Take 25 mg by mouth daily.    . simvastatin (ZOCOR) 10 MG tablet Take 10 mg by mouth daily.    . timolol (BETIMOL) 0.25 % ophthalmic solution 1-2 drops 2 (two) times daily.    . timolol (TIMOPTIC) 0.5 % ophthalmic solution 1 drop 2 (two) times daily.     No current facility-administered medications on file prior to visit.    No past surgical history on file.  Allergies  Allergen Reactions  . Ace Inhibitors   . Dorzolamide   . Lovastatin     Social History   Socioeconomic History  . Marital status: Married    Spouse name: Not on file  . Number of children: Not on file  . Years of education: Not on file  . Highest education level: Not on file  Occupational History  . Not on file  Tobacco Use  . Smoking status: Never Smoker  .  Smokeless tobacco: Never Used  Substance and Sexual Activity  . Alcohol use: No  . Drug use: No  . Sexual activity: Not on file  Other Topics Concern  . Not on file  Social History Narrative  . Not on file   Social Determinants of Health   Financial Resource Strain:   . Difficulty of Paying Living Expenses:   Food Insecurity:   . Worried About Charity fundraiser in the Last Year:   . Arboriculturist in the Last Year:   Transportation Needs:   . Film/video editor (Medical):   Marland Kitchen Lack of Transportation (Non-Medical):   Physical Activity:   . Days of Exercise per Week:   . Minutes of Exercise per Session:   Stress:   . Feeling of Stress :   Social Connections:   . Frequency of Communication with Friends and Family:   . Frequency of Social Gatherings with Friends and Family:   . Attends Religious Services:   . Active Member of Clubs or Organizations:   . Attends Theatre manager Meetings:   Marland Kitchen Marital Status:   Intimate Partner Violence:   . Fear of Current or Ex-Partner:   . Emotionally Abused:   Marland Kitchen Physically Abused:   . Sexually Abused:     No family history on file.  BP (!) 148/77   Ht 5' 11.5" (1.816 m)   Wt 242 lb (109.8 kg)   BMI 33.28 kg/m   Review of Systems: See HPI above.     Objective:  Physical Exam:  Gen: NAD, comfortable in exam room  Left shoulder: No swelling, ecchymoses.  No gross deformity. No TTP. Full IR.  ER limited to 30 degrees actively.  Flexion minimal.  Abduction to 30 degrees. Negative Hawkins, Neers. Strength 4/5 IR, 2/5 ER.  Unable to position for supraspinatus testing. NV intact distally.   Assessment & Plan:  1. Left shoulder injury - consistent with full thickness retracted tears multiple rotator cuff tendons.  We discussed possible surgical intervention again today which he is not interested in.  Pain improving.  Icing, tylenol, topical voltaren gel, motion exercises.  Consider physical therapy or injection if pain becomes more severe.  F/u prn otherwise.

## 2020-01-07 NOTE — Patient Instructions (Addendum)
You have torn your rotator cuff in your left shoulder. Consider an MRI only if you'd consider having surgery to fix this - MRI would tell us to what extent this could be fixed. Icing 15 minutes at a time 3-4 times a day. Tylenol 500mg  1-2 tabs three times a day as needed for pain. Topical voltaren gel up to 4 times a day as needed for pain. Do motion exercises as directed at least twice a day. Call me if you want to do physical therapy or the pain is bad enough you want to do an injection. Follow up with me as needed

## 2022-08-15 ENCOUNTER — Ambulatory Visit: Payer: Medicare Other | Admitting: Family Medicine

## 2022-08-21 ENCOUNTER — Encounter: Payer: Self-pay | Admitting: Family Medicine

## 2022-08-21 ENCOUNTER — Ambulatory Visit: Payer: Medicare Other | Admitting: Family Medicine

## 2022-08-21 ENCOUNTER — Other Ambulatory Visit: Payer: Self-pay

## 2022-08-21 VITALS — BP 118/82 | Ht 70.0 in | Wt 230.0 lb

## 2022-08-21 DIAGNOSIS — M25511 Pain in right shoulder: Secondary | ICD-10-CM

## 2022-08-21 DIAGNOSIS — M19011 Primary osteoarthritis, right shoulder: Secondary | ICD-10-CM

## 2022-08-21 MED ORDER — TRIAMCINOLONE ACETONIDE 40 MG/ML IJ SUSP
40.0000 mg | Freq: Once | INTRAMUSCULAR | Status: AC
  Administered 2022-08-21: 40 mg via INTRA_ARTICULAR

## 2022-08-21 NOTE — Assessment & Plan Note (Signed)
Acute on chronic in nature.  He has findings consistent with degenerative changes of the joint as well as a supraspinatus that appears to be torn and retracted. -Counseled on home exercise therapy and supportive care. -Injection today. -Could consider physical therapy or a nerve block.

## 2022-08-21 NOTE — Progress Notes (Signed)
Jesus Atkins - 89 y.o. male MRN [id number]  Date of birth: 10-05-1932  SUBJECTIVE:  Including CC & ROS.  No chief complaint on file.   Jesus Atkins is a 89 y.o. male that is presenting with acute on chronic right shoulder pain.  He had a fall last October.  He received an injection at that time and did well.  He still feels pain in the shoulder.  He has limitations in his movements.  He does home exercises.   Review of Systems See HPI   HISTORY: Past Medical, Surgical, Social, and Family History Reviewed & Updated per EMR.   Pertinent Historical Findings include:  Past Medical History:  Diagnosis Date   Benign neoplasm of colon    Benign neoplasm of skin    Bilateral lower extremity edema    Central retinal vein occlusion of left eye    Chronic pain of right knee    Complete rupture of rotator cuff    Corns and callosity    Coronary atherosclerosis    Degenerative disc disease, cervical    Diabetes mellitus without complication    Diabetic polyneuropathy associated with type 2 diabetes mellitus    Diverticulosis    GERD (gastroesophageal reflux disease)    Glaucoma    High cholesterol    Hypertension    Hypertensive retinopathy    Obesity    Osteophyte of cervical spine    Presbyopia    Primary open angle glaucoma (POAG) of both eyes    Pseudophakia of both eyes    Psychosexual dysfunction with inhibited sexual excitement    Stage 3 chronic kidney disease    Stasis dermatitis of both legs    Visual disturbances    Visual disturbances     History reviewed. No pertinent surgical history.   PHYSICAL EXAM:  VS: BP 118/82 (BP Location: Left Arm, Patient Position: Sitting)   Ht 5\' 10"  (1.778 m)   Wt 230 lb (104.3 kg)   BMI 33.00 kg/m  Physical Exam Gen: NAD, alert, cooperative with exam, well-appearing MSK:  Neurovascularly intact    Limited ultrasound: right shoulder pain :  Effusion appreciated within the bicipital tendon sheath. Chronic changes of the  subscapularis. The supraspinatus appears to be torn with retraction.  Summary: Findings consistent with effusion of the joint as well as the supraspinatus  Ultrasound and interpretation by Clearance Coots, MD   Aspiration/Injection Procedure Note Jesus Atkins [birth year]  Procedure: Injection Indications: right shoulder pain  Procedure Details Consent: Risks of procedure as well as the alternatives and risks of each were explained to the (patient/caregiver).  Consent for procedure obtained. Time Out: Verified patient identification, verified procedure, site/side was marked, verified correct patient position, special equipment/implants available, medications/allergies/relevent history reviewed, required imaging and test results available.  Performed.  The area was cleaned with iodine and alcohol swabs.    The right glenohumeral joint was injected using 4 cc of 1% lidocaine and 0.4 cc of 8.4% sodium bicarbonate on a 22-gauge 3-1/2 inch needle.  The syringe was switched to mixture containing 1 cc's of 40 mg Kenalog and 4 cc's of 0.25% bupivacaine was injected.  Ultrasound was used. Images were obtained in short views showing the injection.     A sterile dressing was applied.  Patient did tolerate procedure well.    ASSESSMENT & PLAN:   Primary osteoarthritis of right shoulder Acute on chronic in nature.  He has findings consistent with degenerative changes of the joint as well as a supraspinatus that  appears to be torn and retracted. -Counseled on home exercise therapy and supportive care. -Injection today. -Could consider physical therapy or a nerve block.

## 2022-08-21 NOTE — Patient Instructions (Signed)
Good to see you Please use heat before exercise and ice after  Please try the exercises   Please send me a message in MyChart with any questions or updates.  Please see me back in 4 weeks.   --Dr. Tikesha Mort  

## 2022-09-05 ENCOUNTER — Encounter: Payer: Self-pay | Admitting: *Deleted

## 2022-09-18 ENCOUNTER — Ambulatory Visit (INDEPENDENT_AMBULATORY_CARE_PROVIDER_SITE_OTHER): Payer: Medicare Other | Admitting: Family Medicine

## 2022-09-18 VITALS — BP 126/70 | Ht 71.0 in | Wt 232.0 lb

## 2022-09-18 DIAGNOSIS — M19011 Primary osteoarthritis, right shoulder: Secondary | ICD-10-CM

## 2022-09-18 NOTE — Assessment & Plan Note (Signed)
Doing well since the intra-articular injection.  Has good range of motion and little to no pain. -Counseled on home exercise therapy and supportive care. -Could consider physical therapy or referral to a nerve block.

## 2022-09-18 NOTE — Patient Instructions (Signed)
Good to see you Please alternate heat and ice  Please continue the exercises   Please try voltaren over the counter gel. Up to 4 times per day Please send me a message in MyChart with any questions or updates.  Please see me back as needed.   --Dr. Jordan Likes

## 2022-09-18 NOTE — Progress Notes (Signed)
  [name] - 89 y.o. male MRN [id number]  Date of birth: 05/17/1933  SUBJECTIVE:  Including CC & ROS.  No chief complaint on file.   Jesus Atkins is a 89 y.o. male that is  following up for his right shoulder pain. He is doing well since the injection. His range of motion has improved and doesn't have pain at night.     Review of Systems See HPI   HISTORY: Past Medical, Surgical, Social, and Family History Reviewed & Updated per EMR.   Pertinent Historical Findings include:  Past Medical History:  Diagnosis Date   Benign neoplasm of colon    Benign neoplasm of skin    Bilateral lower extremity edema    Central retinal vein occlusion of left eye    Chronic pain of right knee    Complete rupture of rotator cuff    Corns and callosity    Coronary atherosclerosis    Degenerative disc disease, cervical    Diabetes mellitus without complication ([facility])    Diabetic polyneuropathy associated with type 2 diabetes mellitus ([facility])    Diverticulosis    GERD (gastroesophageal reflux disease)    Glaucoma    High cholesterol    Hypertension    Hypertensive retinopathy    Obesity    Osteophyte of cervical spine    Presbyopia    Primary open angle glaucoma (POAG) of both eyes    Pseudophakia of both eyes    Psychosexual dysfunction with inhibited sexual excitement    Stage 3 chronic kidney disease ([facility])    Stasis dermatitis of both legs    Visual disturbances    Visual disturbances     No past surgical history on file.   PHYSICAL EXAM:  VS: BP 126/70   Ht 5\' 11"  (1.803 m)   Wt 232 lb (105.2 kg)   BMI 32.36 kg/m  Physical Exam Gen: NAD, alert, cooperative with exam, well-appearing MSK:  Neurovascularly intact       ASSESSMENT & PLAN:   Primary osteoarthritis of right shoulder Doing well since the intra-articular injection.  Has good range of motion and little to no pain. -Counseled on home exercise therapy and supportive care. -Could consider physical therapy or  referral to a nerve block.

## 2022-11-19 ENCOUNTER — Other Ambulatory Visit: Payer: Self-pay

## 2022-11-19 ENCOUNTER — Encounter (HOSPITAL_COMMUNITY): Payer: Self-pay

## 2022-11-19 ENCOUNTER — Emergency Department (HOSPITAL_COMMUNITY)
Admission: EM | Admit: 2022-11-19 | Discharge: 2022-11-19 | Disposition: A | Discharge status: Home / Self Care | Payer: Medicare Other | Attending: Emergency Medicine | Admitting: Emergency Medicine

## 2022-11-19 DIAGNOSIS — H5711 Ocular pain, right eye: Secondary | ICD-10-CM | POA: Insufficient documentation

## 2022-11-19 DIAGNOSIS — Z7901 Long term (current) use of anticoagulants: Secondary | ICD-10-CM | POA: Diagnosis not present

## 2022-11-19 DIAGNOSIS — R519 Headache, unspecified: Secondary | ICD-10-CM | POA: Insufficient documentation

## 2022-11-19 MED ORDER — HYDROCODONE-ACETAMINOPHEN 5-325 MG PO TABS
1.0000 | ORAL_TABLET | Freq: Once | ORAL | Status: AC
  Administered 2022-11-19: 1 via ORAL
  Filled 2022-11-19: qty 1

## 2022-11-19 MED ORDER — TETRACAINE HCL 0.5 % OP SOLN
1.0000 [drp] | Freq: Once | OPHTHALMIC | Status: AC
  Administered 2022-11-19: 1 [drp] via OPHTHALMIC
  Filled 2022-11-19: qty 4

## 2022-11-19 MED ORDER — FLUORESCEIN SODIUM 1 MG OP STRP
1.0000 | ORAL_STRIP | Freq: Once | OPHTHALMIC | Status: AC
  Administered 2022-11-19: 1 via OPHTHALMIC
  Filled 2022-11-19: qty 1

## 2022-11-19 NOTE — Discharge Instructions (Signed)
You are to go straight to the ER at [facility] where ophthalmology is aware and will be evaluating you for the right eye pain/changes.  [facility] emergency department  [address], [place], [place] [address]  [phone]

## 2022-11-19 NOTE — ED Triage Notes (Signed)
Pt BIB EMS due to eye pain. Pt has glaucoma and got a procedure JUNE 27th, and had increased eye pain since. Axox4.

## 2022-11-19 NOTE — ED Provider Notes (Addendum)
[place] EMERGENCY DEPARTMENT AT [facility] Provider Note   CSN: [id number] Arrival date & time: 11/19/22  0700     History  Chief Complaint  Patient presents with   Eye Pain    Candace Deiters is a 89 y.o. male.  HPI   89 year old male with past medical history of open-angle glaucoma bilaterally who follows with [facility] for ophthalmology, Dr. Lottie Dawson presents the emergency department with worsening right eye pain and headache.  Patient was seen on 6/27 with a retinal specialist and had an injection procedure.  Since then he has been having worsening right eye pain and headache.  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  Almost completely blind in the right eye.  Was post to follow-up with ophthalmology tomorrow at 1 PM.  Had his eyedrops adjusted a couple days ago and daughter states that they have been compliant.  Home Medications Prior to Admission medications   Medication Sig Start Date End Date Taking? Authorizing Provider  acetaminophen (TYLENOL) 500 MG tablet Take 500 mg by mouth every 6 (six) hours as needed.    [provider]  brimonidine (ALPHAGAN P) 0.1 % SOLN     [provider]  diclofenac Sodium (VOLTAREN) 1 % GEL Apply 4 g topically 4 (four) times daily. 01/07/20   Hudnall, Azucena Fallen, MD  ELIQUIS 5 MG TABS tablet Take 5 mg by mouth 2 (two) times daily. 11/23/19   [provider]  furosemide (LASIX) 40 MG tablet Take 40 mg by mouth daily. 09/15/19   [provider]  glimepiride (AMARYL) 2 MG tablet Take 2 mg by mouth daily with breakfast.    [provider]  glipiZIDE (GLUCOTROL XL) 10 MG 24 hr tablet Take 10 mg by mouth daily. 09/29/19   [provider]  latanoprost (XALATAN) 0.005 % ophthalmic solution 1 drop at bedtime.    [provider]  losartan (COZAAR) 50 MG tablet Take 50 mg by mouth daily.    [provider]  metoprolol succinate (TOPROL-XL) 25 MG 24 hr tablet Take 25 mg by mouth  daily.    [provider]  simvastatin (ZOCOR) 10 MG tablet Take 10 mg by mouth daily.    [provider]  timolol (BETIMOL) 0.25 % ophthalmic solution 1-2 drops 2 (two) times daily.    [provider]  timolol (TIMOPTIC) 0.5 % ophthalmic solution 1 drop 2 (two) times daily.    [provider]      Allergies    Ace inhibitors, Dorzolamide, and Lovastatin    Review of Systems   Review of Systems  Constitutional:  Negative for fever.  Eyes:  Positive for pain, redness and visual disturbance. Negative for discharge.  Respiratory:  Negative for shortness of breath.   Cardiovascular:  Negative for chest pain.  Neurological:  Positive for headaches.    Physical Exam Updated Vital Signs BP (!) 165/85   Pulse 61   Temp 97.7 F (36.5 C) (Oral)   Resp 13   Ht 6' (1.829 m)   Wt 99.8 kg   SpO2 94%   BMI 29.84 kg/m  Physical Exam Vitals and nursing note reviewed.  Constitutional:      Appearance: Normal appearance.  HENT:     Head: Normocephalic.     Mouth/Throat:     Mouth: Mucous membranes are moist.  Eyes:     Comments: Right pupil is 3 mm, left pupil is 1 mm and minimally reactive.  The right  sclera is injected and erythematous.  Visual acuity is difficult to fully assess with chronic vision loss.  Right eye pressures are 49, 52.  Left eye pressures 35, 38.  Fluorescein stain is negative on the right.  Cardiovascular:     Rate and Rhythm: Normal rate.  Pulmonary:     Effort: Pulmonary effort is normal. No respiratory distress.  Abdominal:     Palpations: Abdomen is soft.     Tenderness: There is no abdominal tenderness.  Skin:    General: Skin is warm.  Neurological:     Mental Status: He is alert and oriented to person, place, and time. Mental status is at baseline.  Psychiatric:        Mood and Affect: Mood normal.     ED Results / Procedures / Treatments   Labs (all labs ordered are listed, but only abnormal results are  displayed) Labs Reviewed - No data to display  EKG EKG Interpretation Date/Time:  Sunday November 19 2022 07:09:15 EDT Ventricular Rate:  59 PR Interval:  183 QRS Duration:  85 QT Interval:  397 QTC Calculation: 394 R Axis:   76  Text Interpretation: Sinus rhythm Consider left atrial enlargement Confirmed by Coralee Pesa ([id number]) on 11/19/2022 7:17:10 AM  Radiology No results found.  Procedures Procedures    Medications Ordered in ED Medications  fluorescein ophthalmic strip 1 strip (1 strip Right Eye Given 11/19/22 0758)  tetracaine (PONTOCAINE) 0.5 % ophthalmic solution 1 drop (1 drop Right Eye Given 11/19/22 0758)  HYDROcodone-acetaminophen (NORCO/VICODIN) 5-325 MG per tablet 1 tablet (1 tablet Oral Given 11/19/22 4098)    ED Course/ Medical Decision Making/ A&P                             Medical Decision Making Risk Prescription drug management.   89 year old male presents emergency department with worsening right eye pain and headache.  He has known chronic disease in the bilateral eyes, is chronically blind in the left eye and is almost completely blind in the right eye.  Follows with ophthalmology at [facility].  Was seen a couple days ago for injection of the right eye, pressures at that time around 40 per the note.  The patient appears uncomfortable and in pain.  The right eye is red, slightly swollen.  Pupil is 3 mm.  Eye pressures for me today are 49, 52.  Higher than mentioned in previous note.  Consulted on-call ophthalmology for [facility], Dr. Lucy Antigua.  We have reviewed his presentation and they request ER to ER transfer so they may do full eye evaluation.  There was mention to possibly give the patient IV Diamox.  However patient has documented allergy to dorzolamide.  I reviewed this with our ER pharmacist and do not feel it is safe to give IV Diamox at this time.  Patient and family updated about this.  They choose to be transferred by  taxi/[employer].  They have been given all the information to know that they are to report immediately to [facility] ER and that his condition is eye/vision threatening.  Patient transferred to the ER at [facility].        Final Clinical Impression(s) / ED Diagnoses Final diagnoses:  None    Rx / DC Orders ED Discharge Orders     None         Rozelle Logan, DO 11/19/22 1191
